# Patient Record
Sex: Female | Born: 1960 | Race: White
Health system: Northeastern US, Academic
[De-identification: ages and names within clinical notes are randomized; demographics above are authoritative.]

## PROBLEM LIST (undated history)

## (undated) DIAGNOSIS — G2581 Restless legs syndrome: Secondary | ICD-10-CM

## (undated) DIAGNOSIS — E7800 Pure hypercholesterolemia, unspecified: Secondary | ICD-10-CM

## (undated) HISTORY — PX: TONSILLECTOMY: SUR1361

## (undated) HISTORY — PX: APPENDECTOMY: SHX54

## (undated) HISTORY — PX: BACK SURGERY: SHX140

## (undated) HISTORY — PX: TUBAL LIGATION: SHX77

---

## 2019-08-15 ENCOUNTER — Ambulatory Visit (HOSPITAL_BASED_OUTPATIENT_CLINIC_OR_DEPARTMENT_OTHER): Admitting: Family Medicine

## 2019-08-15 NOTE — Progress Notes (Signed)
* * *      [name], Desiree **DOB:** 01/03/1961 (59 yo F) **Acc No.** [id number] **DOS:**  08/15/2019    ---        [name]**    ------    4 Y old Female, DOB: 1960/02/21    [address] [name], [place] [address]    Home: [phone]    Provider: Janina Mayo        * * *    Telephone Encounter    ---    Answered by    Mittie Bodo    Date: 08/15/2019        Time: 12:21 PM    Caller    [name]    ------            Reason    New Patient            Message                      Registered as new patient with CM.  Mailed new pt packet out and pt will notify insurance of PCP.  Patient is requesting to schedule appt prior to getting records.  Has issue with Cholesterol and Kidney.                Action Taken                      [facility]  08/15/2019 12:42:39 PM > I would rather not as much as possible please                    * * *                ---          * * *          Provider: Christell Constant, [name] 08/15/2019    ---    Note generated by eClinicalWorks EMR/PM Software ([email or web address])

## 2019-08-29 ENCOUNTER — Ambulatory Visit: Admitting: Physician Assistant

## 2019-09-02 ENCOUNTER — Ambulatory Visit

## 2019-09-02 ENCOUNTER — Ambulatory Visit: Admitting: Physician Assistant

## 2019-09-13 ENCOUNTER — Ambulatory Visit

## 2020-01-13 ENCOUNTER — Ambulatory Visit (HOSPITAL_BASED_OUTPATIENT_CLINIC_OR_DEPARTMENT_OTHER): Admitting: Family Medicine

## 2020-01-13 NOTE — Progress Notes (Signed)
* * *      Desiree [name], Desiree [name] **DOB:** September 25, 1960 (59 yo F) **Acc No.** [id number] **DOS:**  01/13/2020    ---        [name]**    ------    89 Y old Female, DOB: 09/07/1960    [address] [place], [place] [address]    Home: [phone]    Provider: Janina Mayo        * * *    Telephone Encounter    ---    Answered by    Arlean Hopping    Date: 01/13/2020        Time: 05:16 PM    Reason    Quality care- MD Denied appointment    ------            Message                      [name], This patient wanted to be seen in July- New patient. The patient had requested to be seen w/out records because she has kidney issues. Message was sent to Dr Christell Constant who declined- [name] is on Dr Buel Ream quality care registry...Marland KitchenMarland Kitchen Please advise. (no one has followed up for records or with pt since July 2021)                Action Taken                      Sanchez,Olga  12/19/2019 9:32:36 AM > FYI, not good this is the 2nd new patient that has issues with her.      [name],[name], JR 12/27/2019 1:20:06 AM > please contact the patient and have her brought into the office.  Have her seen with NP/PA for establishment of care.  We can pursue records later      [facility]  01/13/2020 3:47:50 PM > I called patient to make this appointment but ended up having to leave a message instead - her [employer] is not active so will need to see if she has, left message about that too      [name],[name], JR 01/13/2020 4:01:32 PM > noted. SN, see above                    * * *                ---          * * *          Provider: Christell Constant, Chantille Navarrete 01/13/2020    ---    Note generated by eClinicalWorks EMR/PM Software ([email or web address])

## 2021-04-27 ENCOUNTER — Emergency Department
Admission: EM | Admit: 2021-04-27 | Discharge: 2021-04-28 | Disposition: A | Discharge status: Home / Self Care | Payer: No Typology Code available for payment source | Attending: Emergency Medicine | Admitting: Emergency Medicine

## 2021-04-27 ENCOUNTER — Emergency Department: Payer: No Typology Code available for payment source

## 2021-04-27 ENCOUNTER — Other Ambulatory Visit: Payer: Self-pay

## 2021-04-27 ENCOUNTER — Encounter: Payer: Self-pay | Admitting: Emergency Medicine

## 2021-04-27 DIAGNOSIS — R0789 Other chest pain: Secondary | ICD-10-CM | POA: Diagnosis present

## 2021-04-27 DIAGNOSIS — K760 Fatty (change of) liver, not elsewhere classified: Secondary | ICD-10-CM | POA: Insufficient documentation

## 2021-04-27 DIAGNOSIS — R7989 Other specified abnormal findings of blood chemistry: Secondary | ICD-10-CM | POA: Insufficient documentation

## 2021-04-27 DIAGNOSIS — R110 Nausea: Secondary | ICD-10-CM | POA: Insufficient documentation

## 2021-04-27 DIAGNOSIS — E7800 Pure hypercholesterolemia, unspecified: Secondary | ICD-10-CM | POA: Diagnosis not present

## 2021-04-27 DIAGNOSIS — Z8249 Family history of ischemic heart disease and other diseases of the circulatory system: Secondary | ICD-10-CM | POA: Diagnosis not present

## 2021-04-27 DIAGNOSIS — R079 Chest pain, unspecified: Secondary | ICD-10-CM

## 2021-04-27 HISTORY — DX: Pure hypercholesterolemia, unspecified: E78.00

## 2021-04-27 LAB — COMPREHENSIVE METABOLIC PANEL
ALT: 22 U/L (ref 0–44)
AST: 21 U/L (ref 15–41)
Albumin: 4.3 g/dL (ref 3.5–5.0)
Alkaline Phosphatase: 67 U/L (ref 38–126)
Anion gap: 10 (ref 5–15)
BUN: 22 mg/dL (ref 8–23)
CO2: 24 mmol/L (ref 22–32)
Calcium: 9.1 mg/dL (ref 8.9–10.3)
Chloride: 107 mmol/L (ref 98–111)
Creatinine, Ser: 1.26 mg/dL — ABNORMAL HIGH (ref 0.44–1.00)
GFR, Estimated: 49 mL/min — ABNORMAL LOW (ref >60)
Glucose, Bld: 111 mg/dL — ABNORMAL HIGH (ref 70–99)
Potassium: 4.1 mmol/L (ref 3.5–5.1)
Sodium: 141 mmol/L (ref 135–145)
Total Bilirubin: 0.5 mg/dL (ref 0.3–1.2)
Total Protein: 7.4 g/dL (ref 6.5–8.1)

## 2021-04-27 LAB — CBC
HCT: 36.8 % (ref 36.0–46.0)
Hemoglobin: 10.9 g/dL — ABNORMAL LOW (ref 12.0–15.0)
MCH: 21.7 pg — ABNORMAL LOW (ref 26.0–34.0)
MCHC: 29.6 g/dL — ABNORMAL LOW (ref 30.0–36.0)
MCV: 73.2 fL — ABNORMAL LOW (ref 80.0–100.0)
Platelets: 266 10*3/uL (ref 150–400)
RBC: 5.03 MIL/uL (ref 3.87–5.11)
RDW: 14.3 % (ref 11.5–15.5)
WBC: 8.7 10*3/uL (ref 4.0–10.5)
nRBC: 0 % (ref 0.0–0.2)

## 2021-04-27 LAB — LIPASE, BLOOD: Lipase: 47 U/L (ref 11–51)

## 2021-04-27 NOTE — ED Triage Notes (Signed)
Pt to ED via POV, ambulatory to desk holding chest. Pt states CP for approx 20 mins PTA, states substernal that radiates to her back. States pain sharp/crushing in nature. Pt c/o nausea at this time.  ?

## 2021-04-28 ENCOUNTER — Encounter: Payer: Self-pay | Admitting: Emergency Medicine

## 2021-04-28 LAB — TROPONIN I (HIGH SENSITIVITY)
Troponin I (High Sensitivity): 5 ng/L (ref <18)
Troponin I (High Sensitivity): 5 ng/L (ref <18)

## 2021-04-28 NOTE — Discharge Instructions (Signed)
Your workup in the Emergency Department today was reassuring.  We did not find any specific abnormalities.  We recommend you drink plenty of fluids, take your regular medications and/or any new ones prescribed today, and follow up with the doctor(s) listed in these documents as recommended.  Return to the Emergency Department if you develop new or worsening symptoms that concern you.  

## 2021-04-28 NOTE — ED Provider Notes (Signed)
? ?[facility] ?Provider Note ? ? ? Event Date/Time  ? First MD Initiated Contact with Patient 04/27/21 2308   ?  (approximate) ? ? ?History  ? ?Chest Pain ? ? ?HPI ? ?Gloria Oliver is a 61 y.o. female who reports a past medical history of hypercholesterolemia but otherwise denies any chronic medical conditions.  She does not smoke, have diabetes, no hypertension.  She presents for evaluation of pain.  She reports that she has been having episodes of chest pain for several months but they are intermittent and mild.  The pain has been more intense for the last few hours.  She usually takes some aspirin to make it feel better but it did not help tonight and she has had at least 5 baby aspirin.  Nothing in particular seems to make it better or worse.  She describes it as a crushing chest pain in the middle.  She said it is better now than when she first arrived and is currently mild.  There is no numbness or tingling in her arms and the pain is not radiating.  She denies weakness in her extremities, pain in her abdomen, and vomiting.  She occasionally has some nausea. ? ?She reports that her mother had heart attack but there is no other family history.  She is unaware of having any personal history of heart disease.  No history of blood clots in the legs of the lungs.  No recent fever. ?  ? ? ?Physical Exam  ? ?Triage Vital Signs: ?ED Triage Vitals  ?Enc Vitals Group  ?   BP 04/27/21 2252 (!) 165/83  ?   Pulse Rate 04/27/21 2252 74  ?   Resp 04/27/21 2252 18  ?   Temp 04/27/21 2252 (!) 97.5 ?F (36.4 ?C)  ?   Temp Source 04/27/21 2252 Oral  ?   SpO2 04/27/21 2252 100 %  ?   Weight 04/27/21 2253 95.3 kg (210 lb)  ?   Height 04/27/21 2253 1.702 m (5\' 7" )  ?   Head Circumference --   ?   Peak Flow --   ?   Pain Score 04/27/21 2253 10  ?   Pain Loc --   ?   Pain Edu? --   ?   Excl. in GC? --   ? ? ?Most recent vital signs: ?Vitals:  ? 04/27/21 2252  ?BP: (!) 165/83  ?Pulse: 74  ?Resp: 18  ?Temp:  (!) 97.5 ?F (36.4 ?C)  ?SpO2: 100%  ? ? ? ?General: Awake, no distress.  ?CV:  Good peripheral perfusion.  Normal heart sounds. ?Resp:  Normal effort.  Lungs are clear to auscultation. ?Abd:  No distention.  Mild tenderness to palpation of the epigastrium and right upper quadrant with equivocal Murphy's sign.  No lower abdominal tenderness.  No rebound or guarding.  No pulsatile abdominal mass nor bruit. ? ? ?ED Results / Procedures / Treatments  ? ?Labs ?(all labs ordered are listed, but only abnormal results are displayed) ?Labs Reviewed  ?CBC - Abnormal; Notable for the following components:  ?    Result Value  ? Hemoglobin 10.9 (*)   ? MCV 73.2 (*)   ? MCH 21.7 (*)   ? MCHC 29.6 (*)   ? All other components within normal limits  ?COMPREHENSIVE METABOLIC PANEL - Abnormal; Notable for the following components:  ? Glucose, Bld 111 (*)   ? Creatinine, Ser 1.26 (*)   ? GFR, Estimated 49 (*)   ?  All other components within normal limits  ?LIPASE, BLOOD  ?TROPONIN I (HIGH SENSITIVITY)  ?TROPONIN I (HIGH SENSITIVITY)  ? ? ? ?EKG ? ?ED ECG REPORT ?ILoleta Rose, Jissel Slavens, the attending physician, personally viewed and interpreted this ECG. ? ?Date: 04/27/2021 ?EKG Time: 22: 49 ?Rate: 67 ?Rhythm: normal sinus rhythm ?QRS Axis: normal ?Intervals: normal ?ST/T Wave abnormalities: normal ?Narrative Interpretation: no evidence of acute ischemia ? ? ? ?RADIOLOGY ?I personally reviewed the patient's right upper quadrant ultrasound and I see no evidence of cholelithiasis.  The radiologist confirmed that there is no evidence of any acute abnormality. ? ?I also personally reviewed the patient's chest x-ray and see no evidence of acute infection nor widened mediastinum nor pneumothorax.  Radiologist confirms no acute findings. ? ? ? ?PROCEDURES: ? ?Critical Care performed: No ? ?.1-3 Lead EKG Interpretation ?Performed by: Loleta RoseForbach, Delight Bickle, MD ?Authorized by: Loleta RoseForbach, Reise Hietala, MD  ? ?  Interpretation: normal   ?  ECG rate:  76 ?  ECG rate  assessment: normal   ?  Rhythm: sinus rhythm   ?  Ectopy: none   ?  Conduction: normal   ? ? ?MEDICATIONS ORDERED IN ED: ?Medications - No data to display ? ? ?IMPRESSION / MDM / ASSESSMENT AND PLAN / ED COURSE  ?I reviewed the triage vital signs and the nursing notes. ?             ?               ? ?Differential diagnosis includes, but is not limited to, angina, ACS, esophageal spasms, biliary colic, acid reflux, AAS. ? ?The patient is on the cardiac monitor to evaluate for evidence of arrhythmia and/or significant heart rate changes. ? ?Patient is at low risk for AAS and she has a well score of 0 with no significant risk factors for venous thromboembolism.  I personally reviewed her EKG and there is no evidence of ischemia.  Vital signs are stable and within normal limits other than a slightly decreased oral temperature and slight hypertension which could be due to anxiety over her current symptoms and is unlikely to represent an acute issue. ? ?Patient's labs ordered initially include CMP, CBC, high-sensitivity troponin, lipase.  I reviewed the results and her CBC is within normal limits, CMP is within normal limits other than a very slight elevation of her creatinine of 1.26, normal lipase, high-sensitivity troponin within normal limits.  Second high-sensitivity troponin is pending. ? ?Given the possibility of cholelithiasis I obtained a right upper quadrant ultrasound which I reviewed personally as well as reviewing the radiology report, and there is no evidence of acute pathology.  I also reviewed her chest x-ray which is normal without any evidence of acute pathology. ? ?I will reassess the patient after the results of the second troponin to decide whether or not additional imaging or evaluation is needed.  Considering admission for the episodic chest pain, but close outpatient follow-up may also be appropriate based on my conversation with the patient. ? ?Clinical Course as of 04/28/21 0204  ?Sun Apr 28, 2021  ?0200 I reassessed the patient.  She says she feels much better and is now pain-free.  I went over all of her reassuring results.  I explained that we could get a CTA chest/abdomen/pelvis to rule out AAS but I do not think it is necessary and she agrees she does not need to get that at this time.  I also explained that I was considering admission for  chest pain, but I think that she is low risk enough she would be appropriate for outpatient follow-up if she is comfortable with that plan.  She agrees and would rather not stay in the hospital.  I think this is very appropriate.  We talked more about it and she said that she has been under a lot of stress recently at home and that her chest pain started tonight when she had to call the police on her son.  I think that stress is likely playing a large role in her symptoms but she knows that it is important to follow-up with cardiology as an outpatient.  I gave her contact information and also gave her my usual customary return precautions.  She understands and agrees with the plan. [CF]  ?  ?Clinical Course User Index ?[CF] Loleta Rose, MD  ? ? ? ?FINAL CLINICAL IMPRESSION(S) / ED DIAGNOSES  ? ?Final diagnoses:  ?Chest pain, unspecified type  ? ? ? ?Rx / DC Orders  ? ?ED Discharge Orders   ? ? None  ? ?  ? ? ? ?Note:  This document was prepared using Dragon voice recognition software and may include unintentional dictation errors. ?  ?Loleta Rose, MD ?04/28/21 [phone] ? ?

## 2023-02-07 ENCOUNTER — Ambulatory Visit
Admission: EM | Admit: 2023-02-07 | Discharge: 2023-02-07 | Disposition: A | Discharge status: Home / Self Care | Payer: No Typology Code available for payment source | Attending: Emergency Medicine | Admitting: Emergency Medicine

## 2023-02-07 ENCOUNTER — Other Ambulatory Visit: Payer: Self-pay

## 2023-02-07 ENCOUNTER — Encounter: Payer: Self-pay | Admitting: *Deleted

## 2023-02-07 DIAGNOSIS — J069 Acute upper respiratory infection, unspecified: Secondary | ICD-10-CM

## 2023-02-07 HISTORY — DX: Restless legs syndrome: G25.81

## 2023-02-07 MED ORDER — GUAIFENESIN-CODEINE 100-10 MG/5ML PO SOLN: 5.0000 mL | Freq: Four times a day (QID) | ORAL | 0 refills | Status: AC | PRN

## 2023-02-07 MED ORDER — AZITHROMYCIN 250 MG PO TABS: 250.0000 mg | ORAL_TABLET | Freq: Every day | ORAL | 0 refills | Status: AC

## 2023-02-07 MED ORDER — BENZONATATE 100 MG PO CAPS: 100.0000 mg | ORAL_CAPSULE | Freq: Three times a day (TID) | ORAL | 0 refills | Status: AC

## 2023-02-07 NOTE — ED Triage Notes (Signed)
 Upon RN entering exam room, pt standing with c/o chest pain, onset while in waiting area approx 40 min ago. Pt states she gets chest pains frequently and has them checked multiple times in ED and had a cath, and is told it's anxiety. States this feels like a similar chest pain. EKG obtained. Upon EKG completion, pt states chest pain is dissipating.  C/O cough with greenish sputum x 4-5 days, worse at night, along with HAs. Has been taking Tyl and using cough drops, Nyquil, and Dayquil.

## 2023-02-07 NOTE — Discharge Instructions (Addendum)
 You are being treated for upper respiratory infection, on exam lungs are clear and heart is beating in a normal pace and rhythm low suspicion for more serious respiratory condition such as pneumonia  On exam normal heart sounds are heard and your EKG shows that the heart is beating in a regular pace and rhythm, at this time I do not believe more serious involvement of your heart  Symptoms have been present for 10 days and do not seem to be improving therefore you will be started on antibiotic  Begin azithromycin  as directed  You may use Tessalon  pill every 8 hours and/or codeine  cough syrup every 6 hours, please be mindful that codeine  will make you feel drowsy You can take Tylenol and/or Ibuprofen as needed for fever reduction and pain relief.   For cough: honey 1/2 to 1 teaspoon (you can dilute the honey in water or another fluid).  You can also use guaifenesin  and dextromethorphan for cough. You can use a humidifier for chest congestion and cough.  If you don't have a humidifier, you can sit in the bathroom with the hot shower running.      For sore throat: try warm salt water gargles, cepacol lozenges, throat spray, warm tea or water with lemon/honey, popsicles or ice, or OTC cold relief medicine for throat discomfort.   For congestion: take a daily anti-histamine like Zyrtec, Claritin, and a oral decongestant, such as pseudoephedrine.  You can also use Flonase 1-2 sprays in each nostril daily.   It is important to stay hydrated: drink plenty of fluids (water, gatorade/powerade/pedialyte, juices, or teas) to keep your throat moisturized and help further relieve irritation/discomfort.

## 2023-02-07 NOTE — ED Provider Notes (Signed)
 [name]    CSN: [id number] Arrival date & time: 02/07/23  1059      History   Chief Complaint Chief Complaint  Patient presents with   Cough   Chest Pain    HPI Gloria Oliver is a 63 y.o. female.   Patient presents for evaluation of fever, nasal congestion, productive cough with green sputum, bilateral ear pain, sore throat present for 10 days.  While waiting in clinic began to experience centralized chest pain lasting for about 45 minutes, denies shortness of breath, dizziness, lightheadedness.  Feels similar to a panic attack and has occurred before, typically on evaluation endorses deemed to be anxiety.  Has attempted use of Tylenol and cough drops.  Tolerating food and liquids.  No known sick contacts prior.  Past Medical History:  Diagnosis Date   Hypercholesteremia    Restless leg syndrome     There are no active problems to display for this patient.   Past Surgical History:  Procedure Laterality Date   APPENDECTOMY     BACK SURGERY     TONSILLECTOMY     TUBAL LIGATION      OB History   No obstetric history on file.      Home Medications    Prior to Admission medications   Medication Sig Start Date End Date Taking? Authorizing Provider  acetaminophen (TYLENOL) 650 MG CR tablet Take 1 tablet by mouth 3 (three) times daily as needed. 01/29/20  Yes [provider]  atorvastatin (LIPITOR) 10 MG tablet Take 10 mg by mouth daily. 04/11/21  Yes [provider]  azithromycin  (ZITHROMAX ) 250 MG tablet Take 1 tablet (250 mg total) by mouth daily. Take first 2 tablets together, then 1 every day until finished. 02/07/23  Yes Shaelin Lalley R, NP  benzonatate  (TESSALON ) 100 MG capsule Take 1 capsule (100 mg total) by mouth every 8 (eight) hours. 02/07/23  Yes Teresa Shelba SAUNDERS, NP  Cholecalciferol 50 MCG (2000 UT) CAPS Take 2,000 Units by mouth daily.   Yes [provider]  Glycopyrrolate (ROBINUL PO) Take by mouth.   Yes [provider]  guaiFENesin -codeine  100-10 MG/5ML syrup Take 5 mLs by mouth every 6 (six) hours as needed for cough. 02/07/23  Yes Sundiata Ferrick R, NP  omeprazole (PRILOSEC) 20 MG capsule Take 20 mg by mouth daily. 02/01/20  Yes [provider]    Family History History reviewed. No pertinent family history.  Social History Social History   Tobacco Use   Smoking status: Never   Smokeless tobacco: Never  Substance Use Topics   Alcohol use: Not Currently   Drug use: Not Currently     Allergies   Oxycodone   Review of Systems Review of Systems   Physical Exam Triage Vital Signs ED Triage Vitals  Encounter Vitals Group     BP 02/07/23 1148 (!) 173/76     Systolic BP Percentile --      Diastolic BP Percentile --      Pulse Rate 02/07/23 1148 88     Resp 02/07/23 1148 18     Temp 02/07/23 1148 (!) 97 F (36.1 C)     Temp Source 02/07/23 1148 Temporal     SpO2 02/07/23 1148 98 %     Weight --      Height --      Head Circumference --      Peak Flow --      Pain Score 02/07/23 1159 5  Pain Loc --      Pain Education --      Exclude from Growth Chart --    No data found.  Updated Vital Signs BP (!) 173/76 (BP Location: Left Arm)   Pulse 88   Temp (!) 97 F (36.1 C) (Temporal)   Resp 18   SpO2 98%   Visual Acuity Right Eye Distance:   Left Eye Distance:   Bilateral Distance:    Right Eye Near:   Left Eye Near:    Bilateral Near:     Physical Exam Constitutional:      Appearance: Normal appearance.  HENT:     Head: Normocephalic.     Right Ear: Tympanic membrane, ear canal and external ear normal.     Left Ear: Tympanic membrane, ear canal and external ear normal.     Nose: Congestion present. No rhinorrhea.     Mouth/Throat:     Pharynx: No oropharyngeal exudate or posterior oropharyngeal erythema.  Eyes:     Extraocular Movements: Extraocular movements intact.  Cardiovascular:     Rate and Rhythm: Normal rate and regular rhythm.      Pulses: Normal pulses.     Heart sounds: Normal heart sounds.  Pulmonary:     Effort: Pulmonary effort is normal.     Breath sounds: Normal breath sounds.  Musculoskeletal:     Cervical back: Normal range of motion and neck supple.  Neurological:     Mental Status: She is alert and oriented to person, place, and time. Mental status is at baseline.      UC Treatments / Results  Labs (all labs ordered are listed, but only abnormal results are displayed) Labs Reviewed - No data to display  EKG   Radiology No results found.  Procedures Procedures (including critical care time)  Medications Ordered in UC Medications - No data to display  Initial Impression / Assessment and Plan / UC Course  I have reviewed the triage vital signs and the nursing notes.  Pertinent labs & imaging results that were available during my care of the patient were reviewed by me and considered in my medical decision making (see chart for details).  Acute URI  Patient is in no signs of distress nor toxic appearing.  Vital signs are stable.  Low suspicion for pneumonia, pneumothorax or bronchitis and therefore will defer imaging.  Symptoms present for 9 days will provide bacterial coverage.  Prescribed azithromycin , Tessalon  and guaifenesin  codeine  as she has had success with this medicine in the past.  S1 and S2 heard to auscultation, pulse rate stable, EKG shows normal sinus rhythm, low suspicion for cardiac involvement, no prior history.May use additional over-the-counter medications as needed for supportive care.  May follow-up with urgent care as needed if symptoms persist or worsen.   Final Clinical Impressions(s) / UC Diagnoses   Final diagnoses:  Acute URI     Discharge Instructions      You are being treated for upper respiratory infection, on exam lungs are clear and heart is beating in a normal pace and rhythm low suspicion for more serious respiratory condition such as pneumonia  On  exam normal heart sounds are heard and your EKG shows that the heart is beating in a regular pace and rhythm, at this time I do not believe more serious involvement of your heart  Symptoms have been present for 10 days and do not seem to be improving therefore you will be started on antibiotic  Begin  azithromycin  as directed  You may use Tessalon  pill every 8 hours and/or codeine  cough syrup every 6 hours, please be mindful that codeine  will make you feel drowsy You can take Tylenol and/or Ibuprofen as needed for fever reduction and pain relief.   For cough: honey 1/2 to 1 teaspoon (you can dilute the honey in water or another fluid).  You can also use guaifenesin  and dextromethorphan for cough. You can use a humidifier for chest congestion and cough.  If you don't have a humidifier, you can sit in the bathroom with the hot shower running.      For sore throat: try warm salt water gargles, cepacol lozenges, throat spray, warm tea or water with lemon/honey, popsicles or ice, or OTC cold relief medicine for throat discomfort.   For congestion: take a daily anti-histamine like Zyrtec, Claritin, and a oral decongestant, such as pseudoephedrine.  You can also use Flonase 1-2 sprays in each nostril daily.   It is important to stay hydrated: drink plenty of fluids (water, gatorade/powerade/pedialyte, juices, or teas) to keep your throat moisturized and help further relieve irritation/discomfort.    ED Prescriptions     Medication Sig Dispense Auth. Provider   azithromycin  (ZITHROMAX ) 250 MG tablet Take 1 tablet (250 mg total) by mouth daily. Take first 2 tablets together, then 1 every day until finished. 6 tablet Trentan Trippe R, NP   benzonatate  (TESSALON ) 100 MG capsule Take 1 capsule (100 mg total) by mouth every 8 (eight) hours. 21 capsule Arjuna Doeden R, NP   guaiFENesin -codeine  100-10 MG/5ML syrup Take 5 mLs by mouth every 6 (six) hours as needed for cough. 120 mL Yevonne Yokum, Shelba SAUNDERS, NP       I have reviewed the PDMP during this encounter.   Teresa Shelba SAUNDERS, NP 02/07/23 1354

## 2023-08-28 ENCOUNTER — Ambulatory Visit: Admission: EM | Admit: 2023-08-28 | Discharge: 2023-08-28 | Disposition: A | Discharge status: Home / Self Care

## 2023-08-28 ENCOUNTER — Ambulatory Visit
Admission: RE | Admit: 2023-08-28 | Discharge: 2023-08-28 | Disposition: A | Discharge status: Home / Self Care | Source: Ambulatory Visit | Attending: Nurse Practitioner | Admitting: Nurse Practitioner

## 2023-08-28 ENCOUNTER — Telehealth: Payer: Self-pay

## 2023-08-28 ENCOUNTER — Ambulatory Visit: Payer: Self-pay | Admitting: Nurse Practitioner

## 2023-08-28 DIAGNOSIS — R1011 Right upper quadrant pain: Secondary | ICD-10-CM | POA: Diagnosis present

## 2023-08-28 DIAGNOSIS — R110 Nausea: Secondary | ICD-10-CM | POA: Diagnosis not present

## 2023-08-28 DIAGNOSIS — R8281 Pyuria: Secondary | ICD-10-CM | POA: Diagnosis not present

## 2023-08-28 DIAGNOSIS — K760 Fatty (change of) liver, not elsewhere classified: Secondary | ICD-10-CM | POA: Insufficient documentation

## 2023-08-28 LAB — POCT URINALYSIS DIP (MANUAL ENTRY)
Bilirubin, UA: NEGATIVE
Glucose, UA: NEGATIVE mg/dL
Ketones, POC UA: NEGATIVE mg/dL
Nitrite, UA: POSITIVE — AB
Protein Ur, POC: NEGATIVE mg/dL
Spec Grav, UA: 1.015 (ref 1.010–1.025)
Urobilinogen, UA: 0.2 U/dL
pH, UA: 5.5 (ref 5.0–8.0)

## 2023-08-28 MED ORDER — SULFAMETHOXAZOLE-TRIMETHOPRIM 800-160 MG PO TABS: 1.0000 | ORAL_TABLET | Freq: Two times a day (BID) | ORAL | 0 refills | Status: AC

## 2023-08-28 NOTE — Telephone Encounter (Signed)
 HIPAA compliant voicemail message to call back clinic w/ results of imaging.

## 2023-08-28 NOTE — Discharge Instructions (Addendum)
 You are being evaluated for right upper abdominal pain that may be related to your gallbladder. An outpatient ultrasound has been arranged at [facility], and you have been instructed to go directly there to complete the imaging today. Do not eat or drink anything until the ultrasound is finished, as this can interfere with the results and delay further evaluation.  Continue to monitor your symptoms closely. You may use Zofran as directed to manage nausea. Avoid heavy or greasy foods until more is known about the cause of your pain. Rest, stay hydrated with clear fluids if tolerated, and avoid over-the-counter pain medications unless directed, as some can worsen abdominal conditions.  It is very important that you go to the emergency department right away if your pain worsens, becomes severe, or spreads, or if you develop fever, vomiting, yellowing of the skin or eyes (jaundice), weakness, dizziness, or are unable to keep fluids down.

## 2023-08-28 NOTE — Progress Notes (Signed)
 Please call patient regarding test results. Her right upper quadrant ultrasound did not show any gallstones or abnormalities within the gallbladder. However, her urinalysis was positive for nitrites and leukocytes, which may indicate a urinary tract infection, despite the absence of typical urinary symptoms. A urine culture has been sent to assess for bacterial growth and guide further treatment. The patient will be started empirically on antibiotics at this time. She should continue taking Zofran as needed for nausea and to closely monitor her symptoms. She should go to the emergency department if symptoms worsen and follow up with her primary care provider if there is no improvement. Antibiotic therapy may be adjusted depending on the culture results.

## 2023-08-28 NOTE — ED Triage Notes (Signed)
 Patient to Urgent Care with complaints of nausea and RUQ abdominal pain (radiates into right lower back).  Woke up this morning with nausea. States she took zofran then around 1pm started experiencing constant RUQ pain.   Taking zepbound x4 months. Miralax yesterday w/ small BM.

## 2023-08-28 NOTE — ED Provider Notes (Signed)
 [name]    CSN: [id number] Arrival date & time: 08/28/23  1358      History   Chief Complaint Chief Complaint  Patient presents with   Nausea   Abdominal Pain    HPI Gloria Oliver is a 63 y.o. female.   Discussed the use of AI scribe software for clinical note transcription with the patient, who gave verbal consent to proceed.   The patient presents with acute right upper quadrant abdominal pain that started today. The patient reports waking up this morning with nausea, for which she took Zofran, which was effective in alleviating the symptom. She also experienced shakiness throughout the day.  The abdominal pain is described as sharp and constant, currently rated as 6/10 in severity. The pain is localized to the right upper quadrant and is exacerbated by palpation. The patient denies fever or urinary issues. She reports constipation, which she attributes to being on Zepbound for the past 4 months. Yesterday, she took miralax to address the constipation. The patient mentions having a reduced appetite at baseline, typically eating only one meal per day. The patient has a history of appendectomy, with the surgeon initially suspecting gallbladder issues. 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 The patient still has her gallbladder and uterus. She denies any recent travel or bariatric surgery.  The following portions of the patient's history were reviewed and updated as appropriate: allergies, current medications, past family history, past medical history, past social history, past surgical history, and problem list.    Past Medical History:  Diagnosis Date   Hypercholesteremia    Restless leg syndrome     There are no active problems to display for this patient.   Past Surgical History:  Procedure Laterality Date   APPENDECTOMY     BACK SURGERY     TONSILLECTOMY     TUBAL LIGATION      OB History   No obstetric  history on file.      Home Medications    Prior to Admission medications   Medication Sig Start Date End Date Taking? Authorizing Provider  cyclobenzaprine (FLEXERIL) 5 MG tablet Take 5 mg by mouth. 08/10/23  Yes [provider]  ondansetron (ZOFRAN) 4 MG tablet Take 4 mg by mouth. 04/23/23  Yes [provider]  tirzepatide (ZEPBOUND) 7.5 MG/0.5ML injection vial Inject 7.5 mg into the skin. 07/02/23  Yes [provider]  acetaminophen (TYLENOL) 650 MG CR tablet Take 1 tablet by mouth 3 (three) times daily as needed. 01/29/20   [provider]  atorvastatin (LIPITOR) 10 MG tablet Take 10 mg by mouth daily. 04/11/21   [provider]  azithromycin  (ZITHROMAX ) 250 MG tablet Take 1 tablet (250 mg total) by mouth daily. Take first 2 tablets together, then 1 every day until finished. Patient not taking: Reported on 08/28/2023 02/07/23   Teresa Shelba SAUNDERS, NP  benzonatate  (TESSALON ) 100 MG capsule Take 1 capsule (100 mg total) by mouth every 8 (eight) hours. Patient not taking: Reported on 08/28/2023 02/07/23   Teresa Shelba SAUNDERS, NP  Cholecalciferol 50 MCG (2000 UT) CAPS Take 2,000 Units by mouth daily.    [provider]  Glycopyrrolate (ROBINUL PO) Take by mouth.    [provider]  guaiFENesin -codeine  100-10 MG/5ML syrup Take 5 mLs by mouth every 6 (six) hours as needed for cough. Patient not taking: Reported on 08/28/2023 02/07/23   Teresa Shelba SAUNDERS, NP  meloxicam (MOBIC) 15 MG  tablet Take 15 mg by mouth daily.    [provider]  omeprazole (PRILOSEC) 20 MG capsule Take 20 mg by mouth daily. 02/01/20   [provider]    Family History History reviewed. No pertinent family history.  Social History Social History   Tobacco Use   Smoking status: Never   Smokeless tobacco: Never  Substance Use Topics   Alcohol use: Not Currently   Drug use: Not Currently     Allergies   Oxycodone   Review of Systems Review of  Systems  Constitutional:  Negative for fever.  Gastrointestinal:  Positive for abdominal pain, constipation and nausea. Negative for vomiting.  Genitourinary:  Negative for dysuria.  All other systems reviewed and are negative.    Physical Exam Triage Vital Signs ED Triage Vitals  Encounter Vitals Group     BP 08/28/23 1532 (!) 145/83     Girls Systolic BP Percentile --      Girls Diastolic BP Percentile --      Boys Systolic BP Percentile --      Boys Diastolic BP Percentile --      Pulse Rate 08/28/23 1532 100     Resp 08/28/23 1532 18     Temp 08/28/23 1532 98 F (36.7 C)     Temp src --      SpO2 08/28/23 1532 96 %     Weight --      Height --      Head Circumference --      Peak Flow --      Pain Score 08/28/23 1537 6     Pain Loc --      Pain Education --      Exclude from Growth Chart --    No data found.  Updated Vital Signs BP (!) 145/83   Pulse 100   Temp 98 F (36.7 C)   Resp 18   SpO2 96%   Visual Acuity Right Eye Distance:   Left Eye Distance:   Bilateral Distance:    Right Eye Near:   Left Eye Near:    Bilateral Near:     Physical Exam Vitals reviewed.  Constitutional:      General: She is awake. She is not in acute distress.    Appearance: Normal appearance. She is well-developed. She is not ill-appearing, toxic-appearing or diaphoretic.  HENT:     Head: Normocephalic.     Right Ear: Hearing normal.     Left Ear: Hearing normal.     Nose: Nose normal.     Mouth/Throat:     Mouth: Mucous membranes are moist.  Eyes:     General: Vision grossly intact.     Conjunctiva/sclera: Conjunctivae normal.  Cardiovascular:     Rate and Rhythm: Normal rate and regular rhythm.     Heart sounds: Normal heart sounds.  Pulmonary:     Effort: Pulmonary effort is normal.     Breath sounds: Normal breath sounds and air entry.  Abdominal:     General: Bowel sounds are normal. There are no signs of injury.     Palpations: Abdomen is soft.      Tenderness: There is abdominal tenderness in the right upper quadrant. There is no right CVA tenderness, left CVA tenderness, guarding or rebound.  Musculoskeletal:        General: Normal range of motion.     Cervical back: Full passive range of motion without pain, normal range of motion and neck supple.  Skin:    General: Skin is warm and dry.  Neurological:     General: No focal deficit present.     Mental Status: She is alert and oriented to person, place, and time.  Psychiatric:        Speech: Speech normal.        Behavior: Behavior is cooperative.      UC Treatments / Results  Labs (all labs ordered are listed, but only abnormal results are displayed) Labs Reviewed  POCT URINALYSIS DIP (MANUAL ENTRY) - Abnormal; Notable for the following components:      Result Value   Clarity, UA cloudy (*)    Blood, UA trace-intact (*)    Nitrite, UA Positive (*)    Leukocytes, UA Small (1+) (*)    All other components within normal limits  URINE CULTURE    EKG   Radiology No results found.  Procedures Procedures (including critical care time)  Medications Ordered in UC Medications - No data to display  Initial Impression / Assessment and Plan / UC Course  I have reviewed the triage vital signs and the nursing notes.  Pertinent labs & imaging results that were available during my care of the patient were reviewed by me and considered in my medical decision making (see chart for details).     Patient presents with acute right upper quadrant abdominal pain described as sharp and constant, currently rated 6/10. Associated symptoms include nausea, improved with Zofran, and a sensation of shakiness. She also reports constipation, which she attributes to ongoing use of Zepbound over the past four months. A small bowel movement occurred this morning following Miralax use. Patient denies fever, urinary symptoms, or prior similar episodes. She is afebrile, uncomfortable but nontoxic.  UA shows nitrites and leukocytes. Will hold off on antibiotics for now and send off for culture. Given the location and character of pain, gallbladder pathology is a concern. ED evaluation for imaging was recommended, but patient declined and requested outpatient options. An outpatient right upper quadrant ultrasound was ordered and arranged for immediate work-in at [facility] despite the late hour. Patient was instructed to remain NPO until imaging is completed and further recommendations will be made once results are available. She was advised to go to the emergency department immediately if pain worsens, or if she develops fever, vomiting, jaundice, or other concerning symptoms.  Today's evaluation has revealed no signs of a dangerous process. Discussed diagnosis with patient and/or guardian. Patient and/or guardian aware of their diagnosis, possible red flag symptoms to watch out for and need for close follow up. Patient and/or guardian understands verbal and written discharge instructions. Patient and/or guardian comfortable with plan and disposition.  Patient and/or guardian has a clear mental status at this time, good insight into illness (after discussion and teaching) and has clear judgment to make decisions regarding their care  Documentation was completed with the aid of voice recognition software. Transcription may contain typographical errors.   Final Clinical Impressions(s) / UC Diagnoses   Final diagnoses:  Right upper quadrant abdominal pain  Nausea without vomiting  Pyuria     Discharge Instructions      You are being evaluated for right upper abdominal pain that may be related to your gallbladder. An outpatient ultrasound has been arranged at [facility], and you have been instructed to go directly there to complete the imaging today. Do not eat or drink anything until the ultrasound is finished, as this can interfere with the results and  delay further  evaluation.  Continue to monitor your symptoms closely. You may use Zofran as directed to manage nausea. Avoid heavy or greasy foods until more is known about the cause of your pain. Rest, stay hydrated with clear fluids if tolerated, and avoid over-the-counter pain medications unless directed, as some can worsen abdominal conditions.  It is very important that you go to the emergency department right away if your pain worsens, becomes severe, or spreads, or if you develop fever, vomiting, yellowing of the skin or eyes (jaundice), weakness, dizziness, or are unable to keep fluids down.      ED Prescriptions   None    PDMP not reviewed this encounter.   Iola Lukes, [place] 08/28/23 [phone]

## 2023-09-07 ENCOUNTER — Telehealth: Payer: Self-pay

## 2023-09-07 NOTE — Telephone Encounter (Signed)
 Pt Gloria Oliver inquiring about urine culture from 08/28/23 visit. It does not appear a culture was collected.  Attempted to reach patient x1. Gloria Oliver.

## 2023-09-18 IMAGING — US US ABDOMEN LIMITED
1 series · 14 of 25 positions shown · non-contrast
Comparison: None.

CLINICAL DATA: Upper abdominal pain

EXAM:
ULTRASOUND ABDOMEN LIMITED RIGHT UPPER QUADRANT

[Series 1: us abdomen limited ruq (liver/gb) · 14 of 43 slices shown]
[im 1/43]
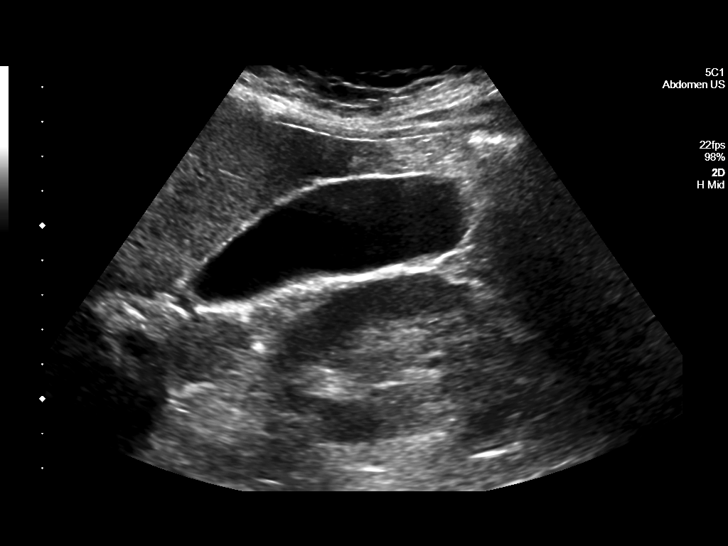
[im 4/43]
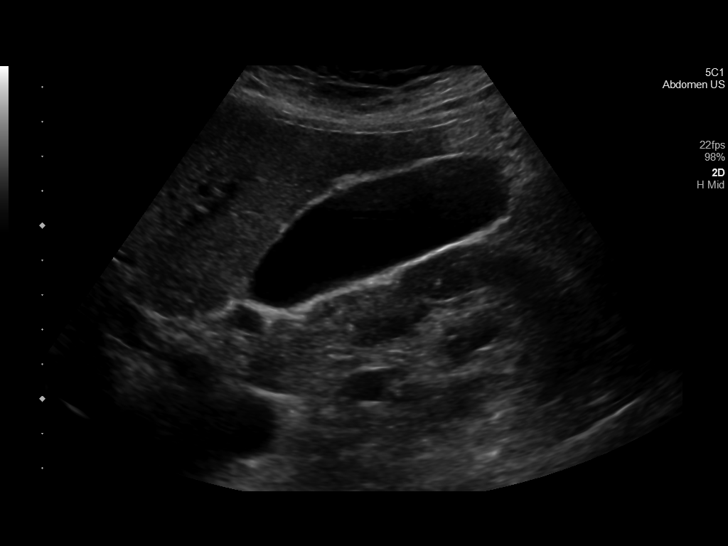
[im 8/43]
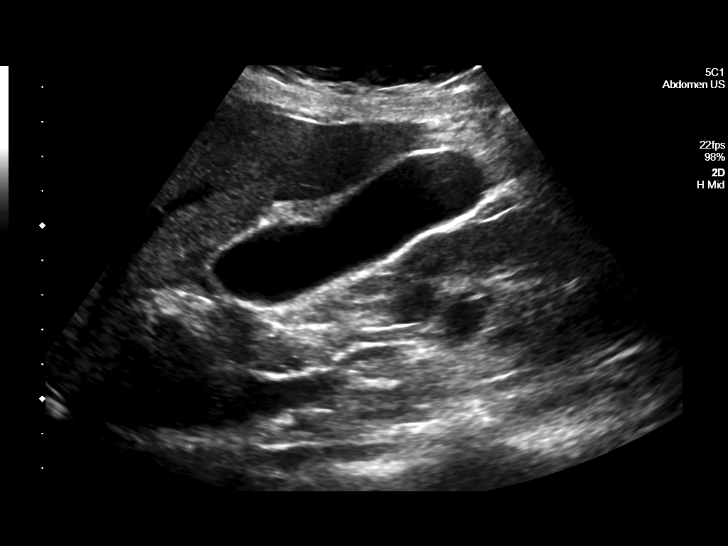
[im 11/43]
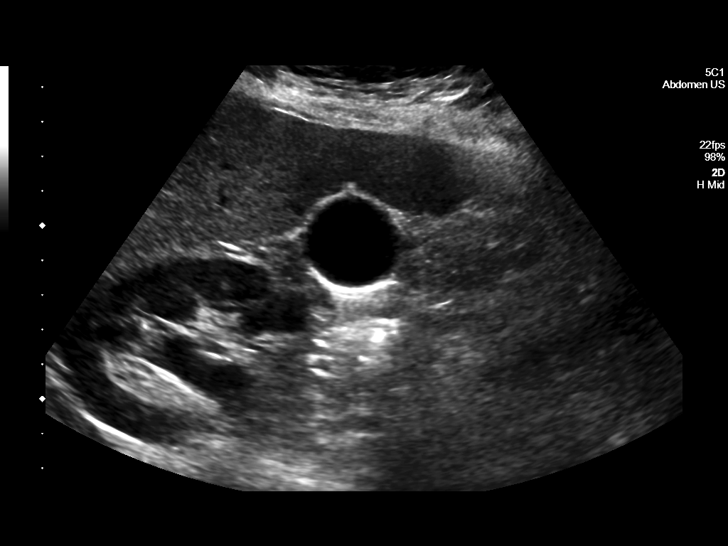
[im 15/43]
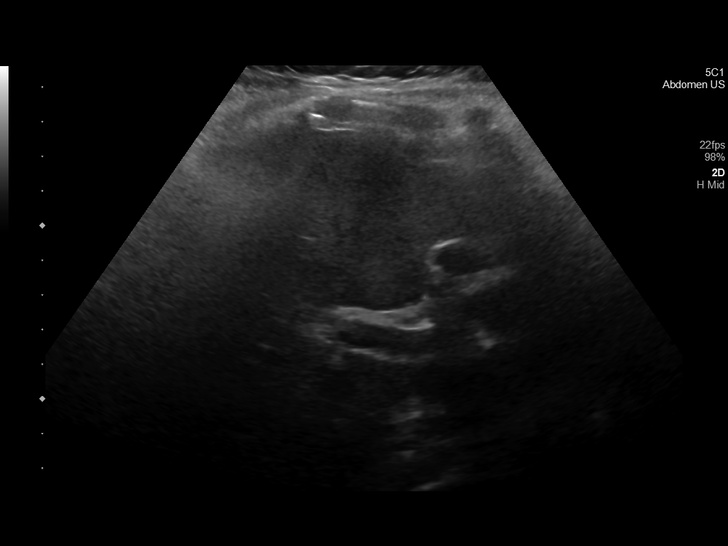
[im 16/43]
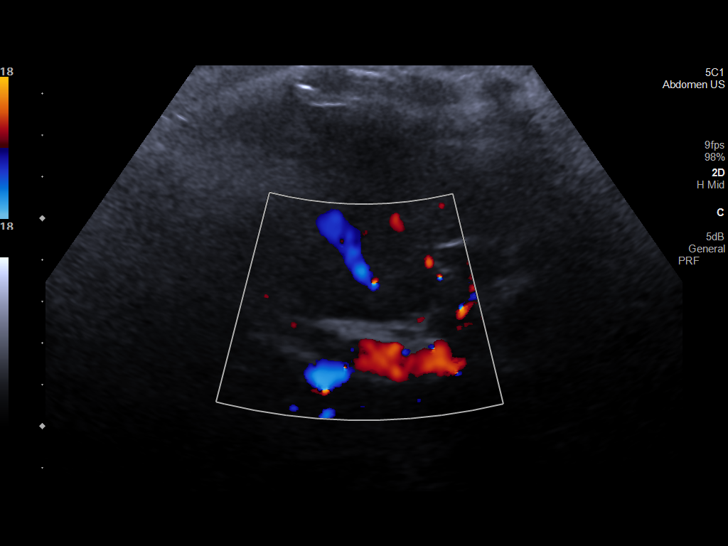
[im 20/43]
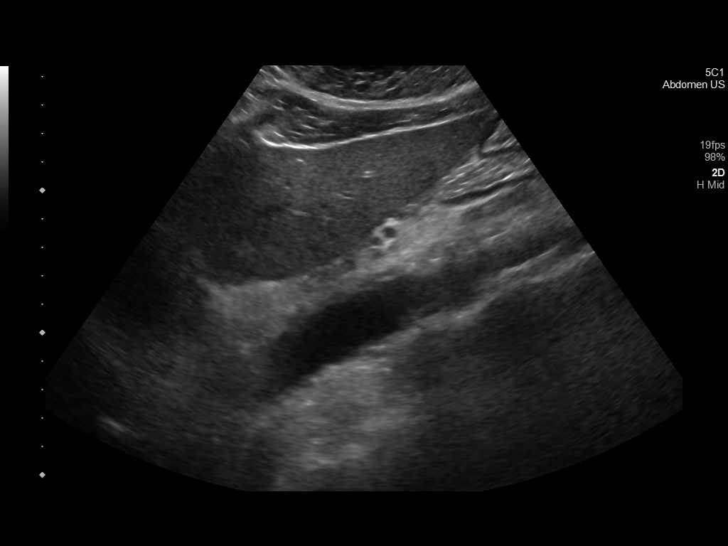
[im 23/43]
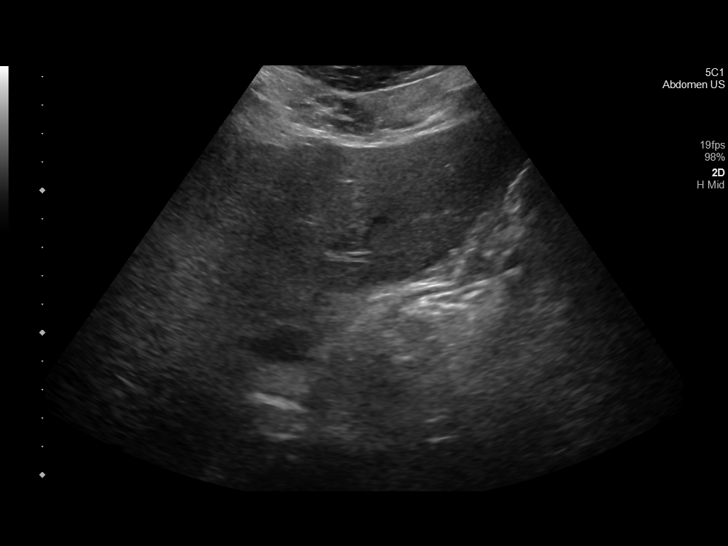
[im 27/43]
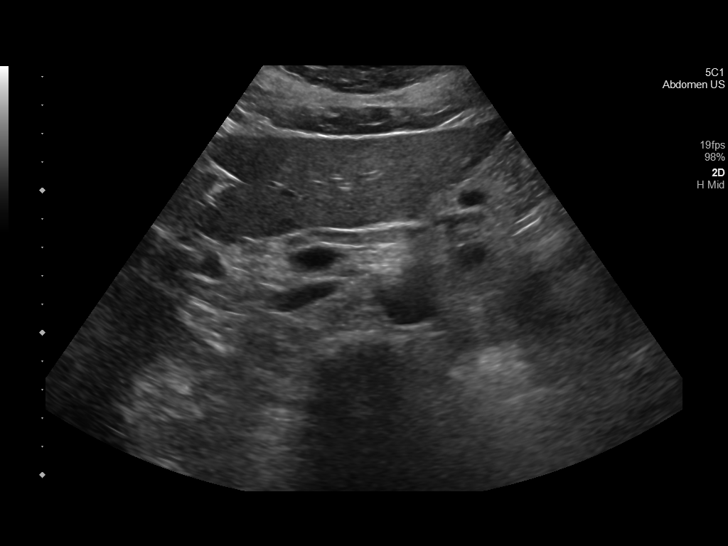
[im 29/43]
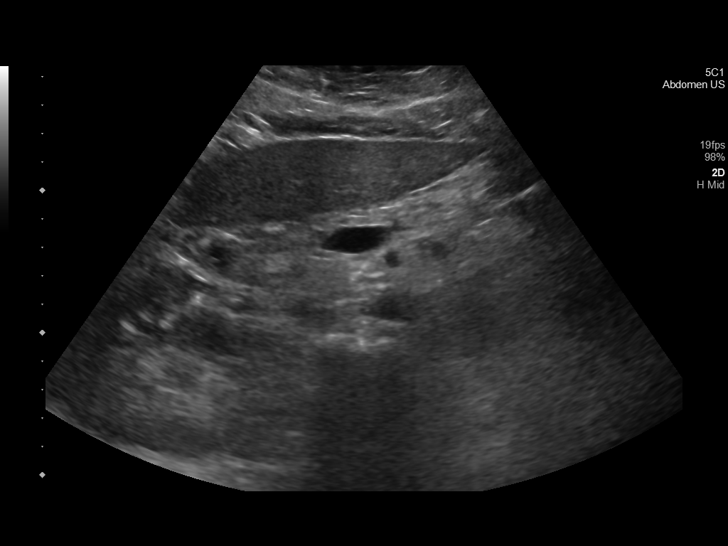
[im 32/43]
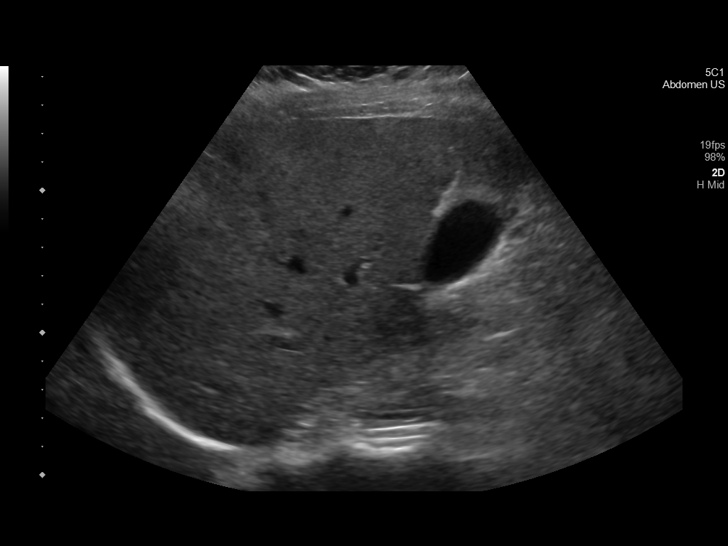
[im 36/43]
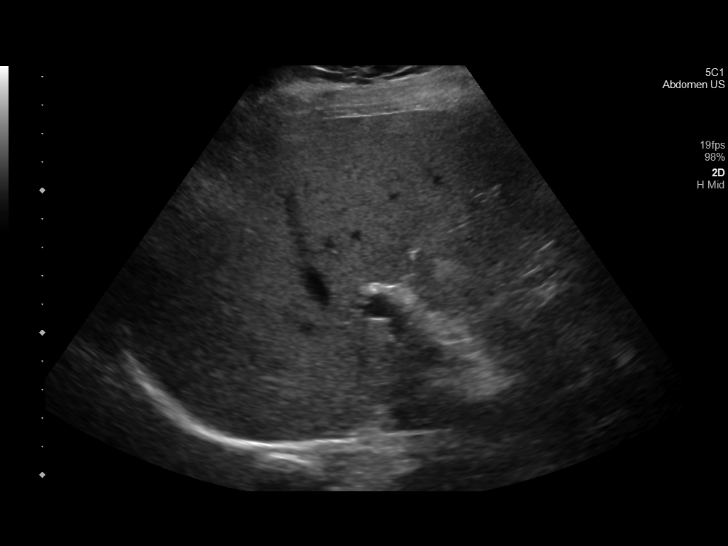
[im 39/43]
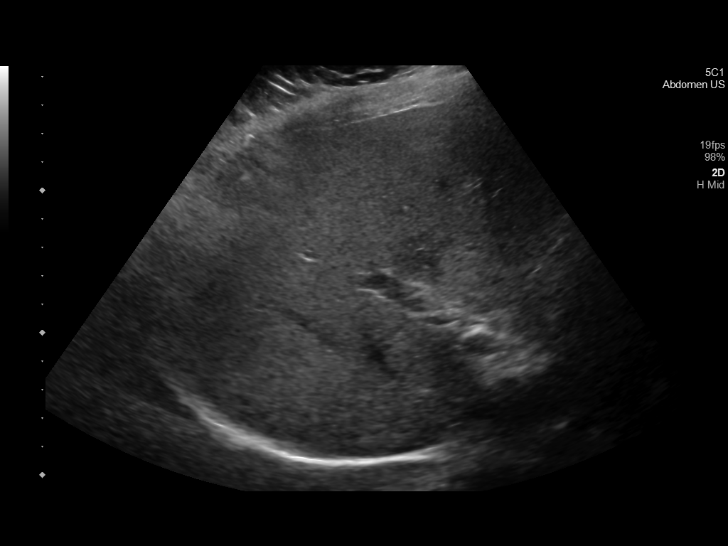
[im 43/43]
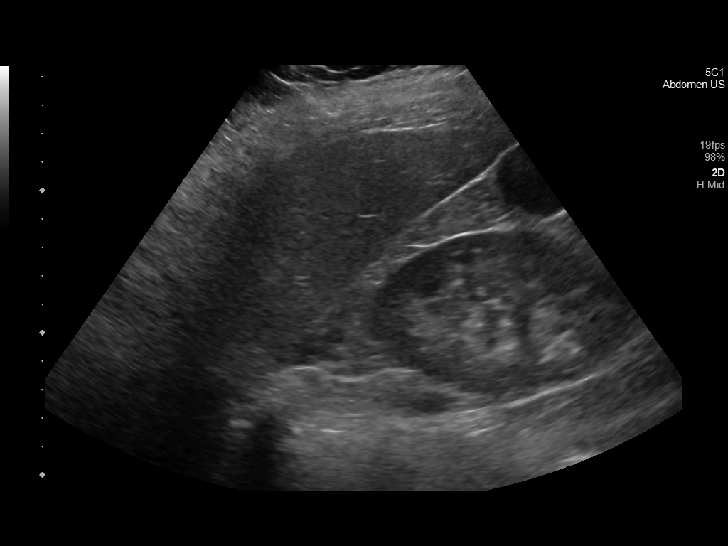

[14 of 25 positions shown; findings below may reference images not displayed]

FINDINGS: Gallbladder:

No gallstones or wall thickening visualized. No sonographic Murphy
sign noted by sonographer.

Common bile duct:

Diameter: 2.9 mm.

Liver:

Mildly increased in echogenicity likely related to fatty
infiltration. Portal vein is patent on color Doppler imaging with
normal direction of blood flow towards the liver.

Other: None.
IMPRESSION: Mild fatty infiltration of the liver.

No other focal abnormality is noted.
